# Patient Record
Sex: Male | Born: 1935 | Race: Black or African American | Hispanic: No
Health system: Southern US, Community
[De-identification: ages and names within clinical notes are randomized; demographics above are authoritative.]

---

## 2004-05-24 ENCOUNTER — Other Ambulatory Visit: Payer: Self-pay

## 2004-05-24 ENCOUNTER — Inpatient Hospital Stay: Payer: Self-pay | Admitting: Cardiovascular Disease

## 2006-09-11 ENCOUNTER — Ambulatory Visit: Payer: Self-pay | Admitting: Otolaryngology

## 2006-10-08 ENCOUNTER — Ambulatory Visit: Payer: Self-pay | Admitting: Vascular Surgery

## 2006-11-02 ENCOUNTER — Ambulatory Visit: Payer: Self-pay | Admitting: Oncology

## 2006-11-05 ENCOUNTER — Ambulatory Visit: Payer: Self-pay | Admitting: Vascular Surgery

## 2006-11-05 ENCOUNTER — Other Ambulatory Visit: Payer: Self-pay

## 2006-11-14 ENCOUNTER — Ambulatory Visit: Payer: Self-pay | Admitting: Vascular Surgery

## 2006-11-29 ENCOUNTER — Inpatient Hospital Stay: Payer: Self-pay | Admitting: Internal Medicine

## 2006-11-29 ENCOUNTER — Other Ambulatory Visit: Payer: Self-pay

## 2006-12-03 ENCOUNTER — Ambulatory Visit: Payer: Self-pay | Admitting: Oncology

## 2006-12-18 ENCOUNTER — Ambulatory Visit: Payer: Self-pay | Admitting: Oncology

## 2007-01-02 ENCOUNTER — Ambulatory Visit: Payer: Self-pay | Admitting: Oncology

## 2007-01-10 ENCOUNTER — Ambulatory Visit: Payer: Self-pay | Admitting: Vascular Surgery

## 2007-01-23 ENCOUNTER — Inpatient Hospital Stay: Payer: Self-pay | Admitting: Vascular Surgery

## 2007-06-02 ENCOUNTER — Ambulatory Visit: Payer: Self-pay | Admitting: Oncology

## 2007-06-14 ENCOUNTER — Ambulatory Visit: Payer: Self-pay | Admitting: Oncology

## 2007-07-03 ENCOUNTER — Ambulatory Visit: Payer: Self-pay | Admitting: Oncology

## 2007-08-02 ENCOUNTER — Ambulatory Visit: Payer: Self-pay | Admitting: Oncology

## 2007-08-09 ENCOUNTER — Ambulatory Visit: Payer: Self-pay | Admitting: Oncology

## 2007-09-02 ENCOUNTER — Ambulatory Visit: Payer: Self-pay | Admitting: Oncology

## 2007-10-02 ENCOUNTER — Ambulatory Visit: Payer: Self-pay | Admitting: Oncology

## 2007-11-01 ENCOUNTER — Ambulatory Visit: Payer: Self-pay | Admitting: Oncology

## 2007-11-02 ENCOUNTER — Ambulatory Visit: Payer: Self-pay | Admitting: Oncology

## 2007-11-22 IMAGING — CT CT ANGIOGRAPHY NECK
1 of 4 series · 12 of 33 positions shown · IV contrast (APPLIED)
Comparison: none

REASON FOR EXAM: carotid stenosis
COMMENTS:

PROCEDURE:     CT  - CT ANGIOGRAPHY NECK W/WO  - October 08, 2006  [DATE]
RESULT:
HISTORY: Carotid stenosis.

[Series 4: soft tissue · axial · 0.39mm/px · z∈[-293,-32]mm · 12 of 103 slices shown]
[im 8/103  soft-tissue]
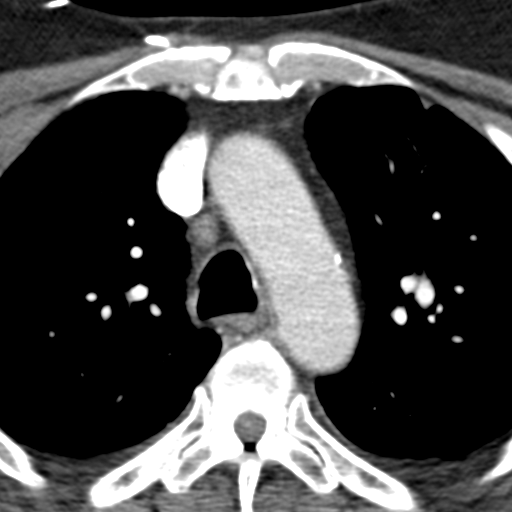
[im 16/103  bone]
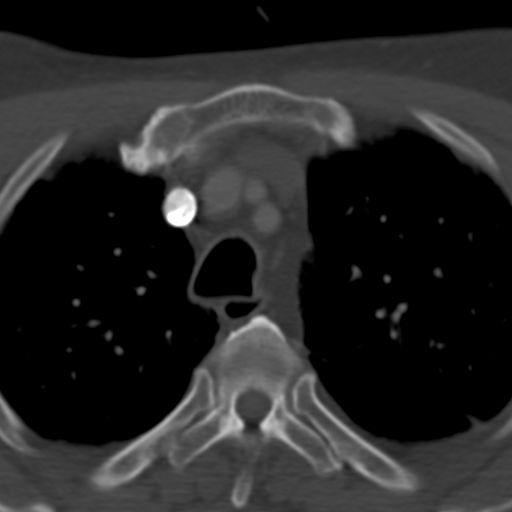
[im 24/103  soft-tissue]
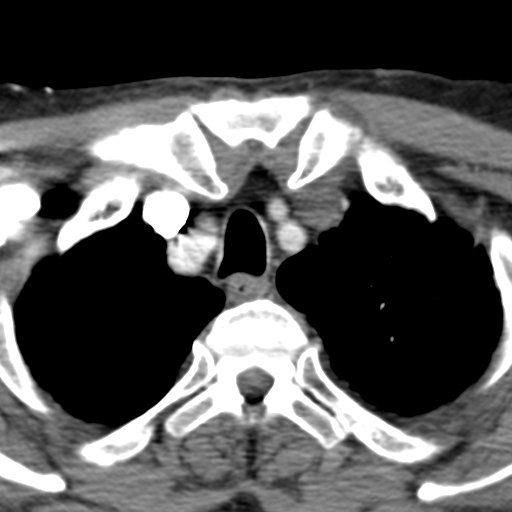
[im 32/103  bone]
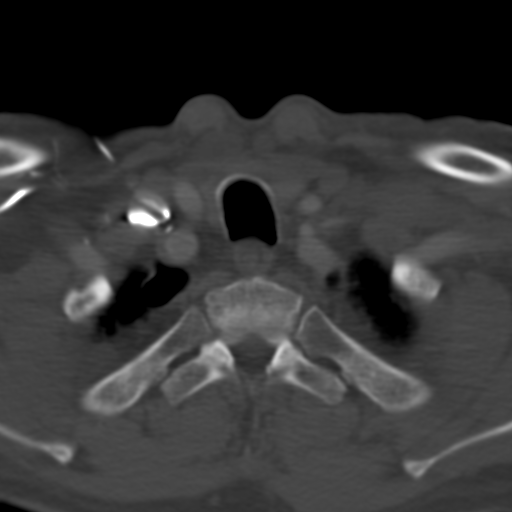
[im 40/103  soft-tissue]
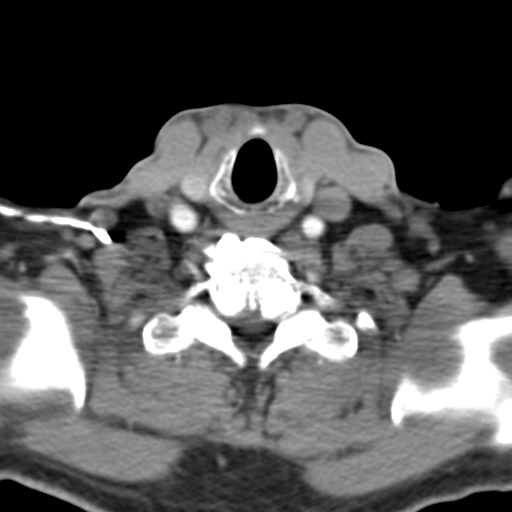
[im 48/103  bone]
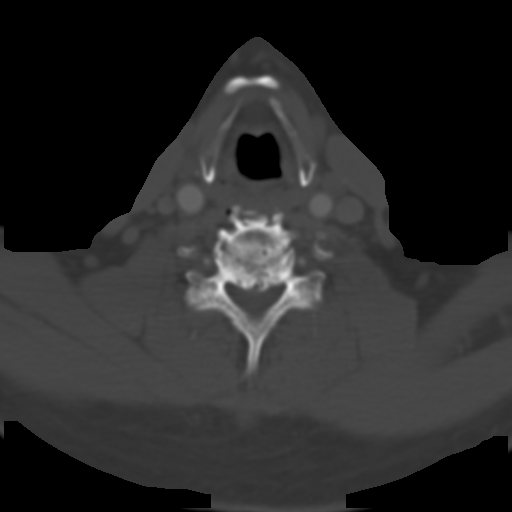
[im 55/103  soft-tissue]
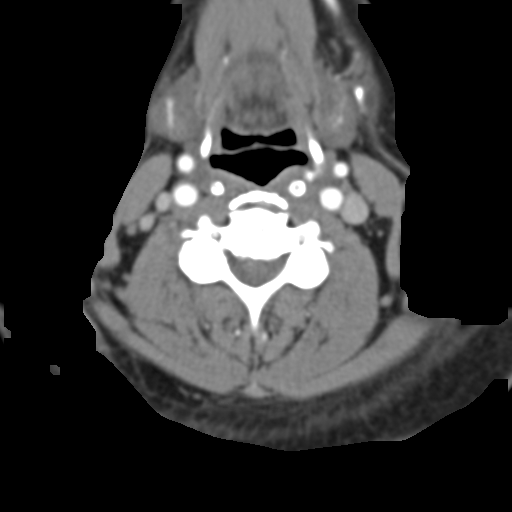
[im 63/103  bone]
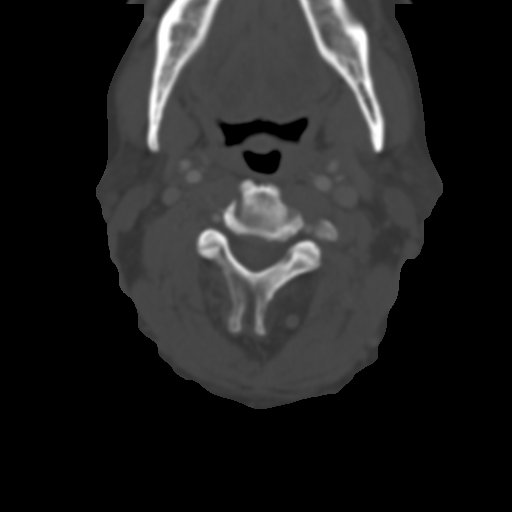
[im 71/103  soft-tissue]
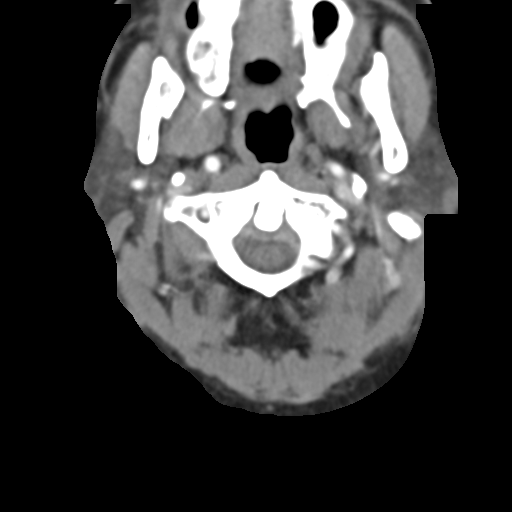
[im 79/103  bone]
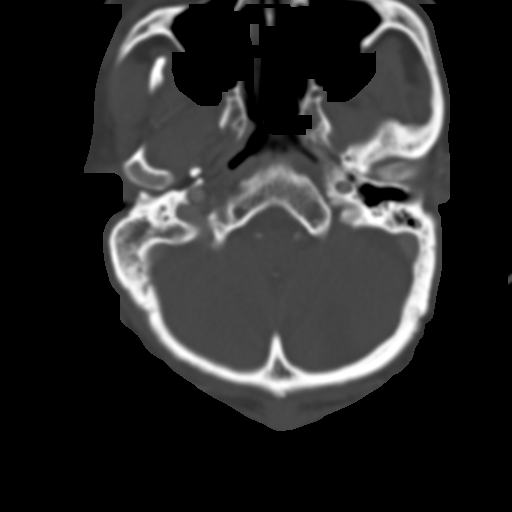
[im 87/103  soft-tissue]
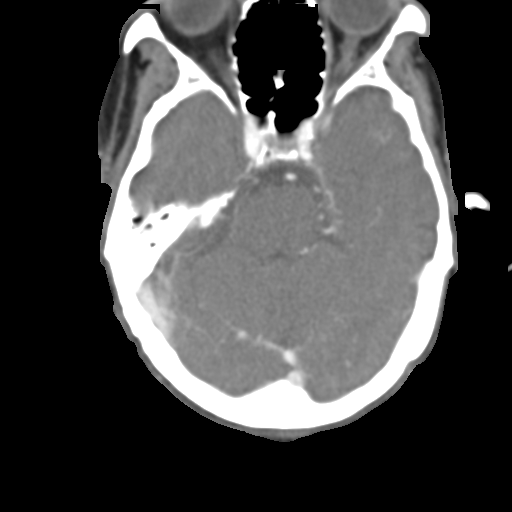
[im 95/103  bone]
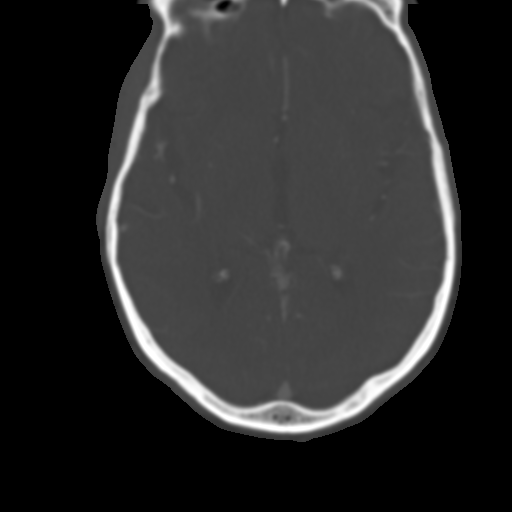

[12 of 33 positions shown; findings below may reference images not displayed]

COMPARISON STUDIES:  Prior ultrasound of the carotids dated 09/11/06.

PROCEDURE AND FINDINGS:  Standard nonionic contrast enhanced CTA is
obtained. Approximately 2.0 cm distal to the LEFT carotid bifurcation is an
approximately 90 to 95%  stenosis involving the LEFT internal carotid
artery. This is at a level slightly higher than the mandibular angle.  The
LEFT common carotid artery is widely patent and arises from the innominate.
On the RIGHT, mild calcified plaque is present. No evidence of RIGHT carotid
system stenosis. The subclavian and vertebrals appear to be patent. Shotty
cervical lymph nodes are noted.
IMPRESSION: High-grade 90 to 95% stenosis proximal LEFT internal carotid artery
approximately 2.0 cm distal to the LEFT carotid bifurcation.  The stenosis
is at the level to slightly above the level of the angle of the mandible.

## 2007-12-03 ENCOUNTER — Ambulatory Visit: Payer: Self-pay | Admitting: Internal Medicine

## 2007-12-03 ENCOUNTER — Ambulatory Visit: Payer: Self-pay | Admitting: Oncology

## 2008-01-02 ENCOUNTER — Ambulatory Visit: Payer: Self-pay | Admitting: Internal Medicine

## 2008-01-13 IMAGING — CR DG CHEST 1V PORT
1 series · 1 of 1 positions shown · non-contrast
Comparison: none

REASON FOR EXAM: weakness
COMMENTS:

PROCEDURE:     DXR - DXR PORTABLE CHEST SINGLE VIEW  - November 29, 2006  [DATE]
RESULT:     Comparison: 11/05/2006.

[view not recorded]
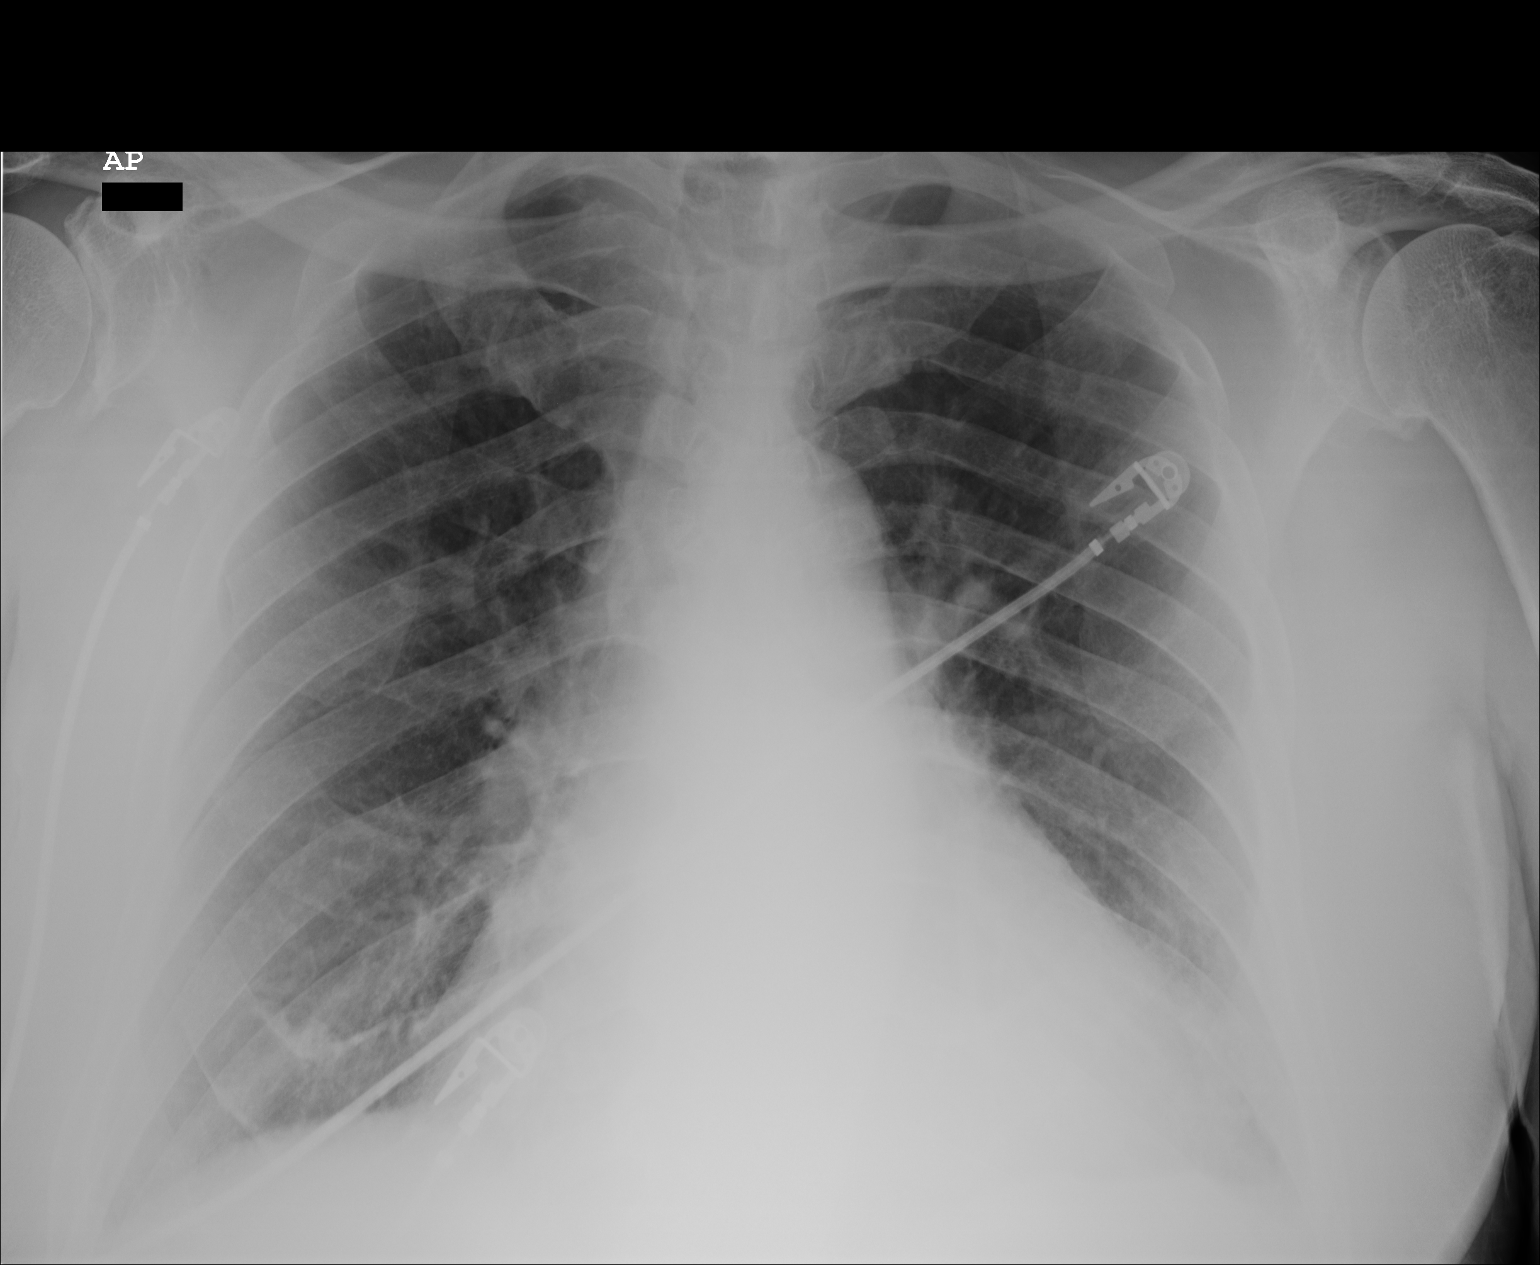

[1 of 1 positions shown; findings below may reference images not displayed]

FINDINGS: There is no significant pulmonary consolidation, pulmonary edema, pleural
effusion, nor pneumothorax. Stable moderate cardiomegaly.
No grossly displaced rib fracture is noted. Mild to moderate degenerative
changes of the bilateral glenohumeral joints are seen.
IMPRESSION: 1. Stable moderate cardiomegaly. No acute cardiopulmonary abnormality is
noted.

## 2008-01-24 ENCOUNTER — Ambulatory Visit: Payer: Self-pay | Admitting: Oncology

## 2009-10-10 ENCOUNTER — Emergency Department: Payer: Self-pay | Admitting: Emergency Medicine

## 2009-10-18 ENCOUNTER — Emergency Department: Payer: Self-pay | Admitting: Emergency Medicine

## 2010-04-05 ENCOUNTER — Emergency Department: Payer: Self-pay | Admitting: Unknown Physician Specialty

## 2010-11-28 ENCOUNTER — Emergency Department: Payer: Self-pay | Admitting: Emergency Medicine

## 2011-03-22 ENCOUNTER — Inpatient Hospital Stay: Payer: Self-pay | Admitting: Internal Medicine

## 2011-07-03 ENCOUNTER — Ambulatory Visit: Payer: Self-pay | Admitting: Internal Medicine

## 2011-07-21 LAB — CBC
HCT: 27.8 % — ABNORMAL LOW (ref 40.0–52.0)
MCH: 29.6 pg (ref 26.0–34.0)
MCHC: 32.6 g/dL (ref 32.0–36.0)
MCV: 91 fL (ref 80–100)
RDW: 19.4 % — ABNORMAL HIGH (ref 11.5–14.5)

## 2011-07-21 LAB — COMPREHENSIVE METABOLIC PANEL
Albumin: 3 g/dL — ABNORMAL LOW (ref 3.4–5.0)
Alkaline Phosphatase: 173 U/L — ABNORMAL HIGH (ref 50–136)
Anion Gap: 13 (ref 7–16)
Bilirubin,Total: 0.4 mg/dL (ref 0.2–1.0)
Chloride: 104 mmol/L (ref 98–107)
Creatinine: 6.86 mg/dL — ABNORMAL HIGH (ref 0.60–1.30)
EGFR (African American): 8 — ABNORMAL LOW
EGFR (Non-African Amer.): 7 — ABNORMAL LOW
Glucose: 101 mg/dL — ABNORMAL HIGH (ref 65–99)
Potassium: 4.7 mmol/L (ref 3.5–5.1)
Total Protein: 7 g/dL (ref 6.4–8.2)

## 2011-07-21 LAB — TROPONIN I: Troponin-I: <0.02 ng/mL

## 2011-07-21 LAB — LIPASE, BLOOD: Lipase: 1391 U/L — ABNORMAL HIGH (ref 73–393)

## 2011-07-22 ENCOUNTER — Inpatient Hospital Stay: Payer: Self-pay | Admitting: Internal Medicine

## 2011-07-22 LAB — URINALYSIS, COMPLETE
Bilirubin,UR: NEGATIVE
Ketone: NEGATIVE
Nitrite: NEGATIVE
Ph: 5 (ref 4.5–8.0)
Protein: 100
RBC,UR: 596 /HPF (ref 0–5)
Specific Gravity: 1.015 (ref 1.003–1.030)
Squamous Epithelial: NONE SEEN

## 2011-07-23 LAB — BASIC METABOLIC PANEL
Anion Gap: 13 (ref 7–16)
Chloride: 111 mmol/L — ABNORMAL HIGH (ref 98–107)
Co2: 17 mmol/L — ABNORMAL LOW (ref 21–32)
Creatinine: 5.86 mg/dL — ABNORMAL HIGH (ref 0.60–1.30)
EGFR (Non-African Amer.): 8 — ABNORMAL LOW
Glucose: 63 mg/dL — ABNORMAL LOW (ref 65–99)
Osmolality: 299 (ref 275–301)

## 2011-07-23 LAB — LIPASE, BLOOD: Lipase: 406 U/L — ABNORMAL HIGH (ref 73–393)

## 2011-07-23 LAB — URINE CULTURE

## 2011-07-24 LAB — CBC WITH DIFFERENTIAL/PLATELET
Basophil #: 0.1 10*3/uL (ref 0.0–0.1)
Eosinophil #: 0.4 10*3/uL (ref 0.0–0.7)
Eosinophil %: 1.9 %
HGB: 8.3 g/dL — ABNORMAL LOW (ref 13.0–18.0)
Lymphocyte #: 0.7 10*3/uL — ABNORMAL LOW (ref 1.0–3.6)
MCH: 28.6 pg (ref 26.0–34.0)
Monocyte #: 1.2 x10 3/mm — ABNORMAL HIGH (ref 0.2–1.0)
Monocyte %: 6.6 %
Neutrophil %: 87.5 %
RBC: 2.91 10*6/uL — ABNORMAL LOW (ref 4.40–5.90)

## 2011-07-24 LAB — BASIC METABOLIC PANEL
Anion Gap: 15 (ref 7–16)
BUN: 61 mg/dL — ABNORMAL HIGH (ref 7–18)
Co2: 15 mmol/L — ABNORMAL LOW (ref 21–32)
EGFR (African American): 12 — ABNORMAL LOW
Osmolality: 308 (ref 275–301)
Potassium: 5.1 mmol/L (ref 3.5–5.1)
Sodium: 147 mmol/L — ABNORMAL HIGH (ref 136–145)

## 2011-07-25 LAB — BASIC METABOLIC PANEL
Anion Gap: 13 (ref 7–16)
Co2: 16 mmol/L — ABNORMAL LOW (ref 21–32)
Creatinine: 4.87 mg/dL — ABNORMAL HIGH (ref 0.60–1.30)
Glucose: 111 mg/dL — ABNORMAL HIGH (ref 65–99)
Osmolality: 302 (ref 275–301)
Sodium: 143 mmol/L (ref 136–145)

## 2011-07-25 LAB — CBC WITH DIFFERENTIAL/PLATELET
Basophil %: 0.3 %
HCT: 26.9 % — ABNORMAL LOW (ref 40.0–52.0)
Lymphocyte #: 0.7 10*3/uL — ABNORMAL LOW (ref 1.0–3.6)
Lymphocyte %: 3.2 %
MCH: 29 pg (ref 26.0–34.0)
MCHC: 31.5 g/dL — ABNORMAL LOW (ref 32.0–36.0)
MCV: 92 fL (ref 80–100)
Monocyte #: 1.4 x10 3/mm — ABNORMAL HIGH (ref 0.2–1.0)
Platelet: 177 10*3/uL (ref 150–440)
WBC: 20.7 10*3/uL — ABNORMAL HIGH (ref 3.8–10.6)

## 2011-07-26 LAB — CBC WITH DIFFERENTIAL/PLATELET
Basophil #: 0 10*3/uL (ref 0.0–0.1)
Basophil %: 0.3 %
Eosinophil %: 3.3 %
HGB: 7.9 g/dL — ABNORMAL LOW (ref 13.0–18.0)
MCH: 29.5 pg (ref 26.0–34.0)
MCV: 92 fL (ref 80–100)
Monocyte %: 7.2 %
Neutrophil #: 13.7 10*3/uL — ABNORMAL HIGH (ref 1.4–6.5)
Neutrophil %: 84.7 %
Platelet: 157 10*3/uL (ref 150–440)
RBC: 2.7 10*6/uL — ABNORMAL LOW (ref 4.40–5.90)
WBC: 16.2 10*3/uL — ABNORMAL HIGH (ref 3.8–10.6)

## 2011-07-26 LAB — BASIC METABOLIC PANEL
Anion Gap: 13 (ref 7–16)
Calcium, Total: 8.8 mg/dL (ref 8.5–10.1)
Co2: 15 mmol/L — ABNORMAL LOW (ref 21–32)
EGFR (African American): 13 — ABNORMAL LOW
EGFR (Non-African Amer.): 11 — ABNORMAL LOW
Glucose: 95 mg/dL (ref 65–99)
Potassium: 4.5 mmol/L (ref 3.5–5.1)

## 2011-07-27 LAB — CULTURE, BLOOD (SINGLE)

## 2011-08-02 ENCOUNTER — Ambulatory Visit: Payer: Self-pay | Admitting: Internal Medicine

## 2011-08-02 DEATH — deceased

## 2014-07-26 NOTE — H&P (Signed)
PATIENT NAMESHELDON, Russell Wolfe MR#:  [id number] DATE OF BIRTH:  02/10/1925  DATE OF ADMISSION:  07/22/2011  PRIMARY CARE PHYSICIAN: Dr. Ellsworth Lennox  CHIEF COMPLAINT: Abdominal pain.   HISTORY OF PRESENT ILLNESS: [name] is an 81 year old African American male with past medical history of chronic kidney disease, history of systemic hypertension, prostate cancer, history of bladder cancer followed up at [facility], history of anemia, last  admitted to this hospital in December 2012 with hemoglobin of 6.3. The patient was brought to the Emergency Department for evaluation of abdominal pain located at the mid abdomen and across the abdomen to the side, especially the left side. This is ongoing for one week. The pain is described as sharp pain, moderate in intensity, with variable severity in that the pain comes and goes. The patient reports no vomiting, no diarrhea, no fever, no chills. Evaluation here in the Emergency Department reveals advanced renal failure. Baseline creatinine was 2 in December and now it is up to 6. His lipase is elevated at more than 1300. His white blood cell count is elevated to more than 20,000. He has elevated liver enzymes and there is also evidence of significant urinary tract infection. The patient was admitted for further evaluation and treatment.   REVIEW OF SYSTEMS: The patient denies having any fever. No chills, but he admits having fatigue. EYES: No blurring of vision. No double vision except for some chronic visual problems in the left eye. He has history of glaucoma. ENT: No sore throat but he has a dry mouth and mucous membranes, mild dysphagia. Some hearing impairment on the left side. CARDIOVASCULAR: No chest pain. No shortness of breath. No syncope. He admits having irregular heart beats, but he states that this has been for a long time, many years. RESPIRATORY: No shortness of breath. No cough. No sputum production. GASTROINTESTINAL: Admits having abdominal pain. No nausea,  no vomiting. No diarrhea. GENITOURINARY: No dysuria or frequency of urination. MUSCULOSKELETAL: No joint pain or swelling. No muscular pain or swelling. INTEGUMENT: No skin rash. No ulcers. NEUROLOGY: No focal weakness. No seizure activity. No headache. PSYCH: No anxiety or depression. ENDOCRINE: No polyuria or polydipsia. No heat or cold intolerance.   PAST MEDICAL HISTORY:  1. History of bladder cancer followed at [facility].  2. History of anemia. His hemoglobin last December was 6.2. He was transfused.  3. History of prostate cancer.  4. History of systemic hypertension.  5. History of carotid stenosis status post left carotid stent implant.  6. History of coronary artery disease.  7. History of congestive cardiomyopathy with ejection fraction of 35%.  8. History of left eye glaucoma.   PAST SURGICAL HISTORY:  1. History of hemorrhoidectomy. 2. History of cataract removal.   SOCIAL HABITS: Ex- chronic smoker. He quit 20 years ago and he has a history of 20 years of smoking. No alcoholism or other drug abuse.   SOCIAL HISTORY: The patient is widowed, lives at home with his son.   FAMILY HISTORY: Negative for cancer and diabetes and negative for premature coronary artery disease.   ADMISSION MEDICATIONS:  1. Aspirin 81 mg a day.  2. Renagel 800 mg 3 times a day.  3. Oxycodone 5 mg every six hours p.r.n.  4. Amlodipine 5 mg once a day.   ALLERGIES: No known drug allergies.   PHYSICAL EXAMINATION:  VITAL SIGNS: Blood pressure 173/86, respiratory rate 20, pulse 78, temperature 99.1, oxygen saturation 93%.   GENERAL APPEARANCE: Elderly male laying in  bed in no acute distress. He appears lethargic and appears dehydrated.   HEAD: Mild pallor. No icterus. No cyanosis.   ENT: Hearing was normal. Nasal mucosa, lips, and tongue showed dry mucous membranes.   EYES: Normal eyelids and conjunctiva. Could not detect reactivity of pupils to light.   NECK: Supple. Trachea at midline.  No cervical masses.   HEART: Regular S1, S2. No S3 or S4. No murmur. No gallop. No carotid bruits.   RESPIRATORY: Normal breathing pattern without use of accessory muscles. No rales. No wheezing.   ABDOMEN: Soft without tenderness. No rebound. No rigidity. No hepatosplenomegaly. No masses. No hernias.   SKIN: No ulcers. No subcutaneous nodules.   MUSCULOSKELETAL:  No joint swelling. No clubbing.   NEUROLOGIC: Cranial nerves II through XII are intact. No focal motor deficit.   PSYCHIATRIC: The patient is alert, oriented to place and people. Mood and affect were flat.   LABS/STUDIES: Ultrasound of the abdomen revealed right hydronephrosis. There is an apparent mass in the region of the right kidney and/or upper ureter with possible second mid right abdominal mass. There is also at least one mass in the urinary bladder. Nonspecific appearance of the gallbladder perhaps including a neoplastic process. Presumed liver metastasis. EKG showed atrial fibrillation and flutter with controlled ventricular rate at 78 per minute. Otherwise unremarkable EKG. Blood sugar 101, BUN 75, creatinine 6.8, sodium 137, potassium 4.7. Estimated GFR 7. Anion gap was normal at 13. Lipase was significantly elevated at 1391. Serum protein 7, albumin 3, total bilirubin 0.4, alkaline phosphatase elevated at 173, AST 54, ALT 26. Troponin less than 0.02. CBC showed white count of 21,000, hemoglobin 9, hematocrit 27, platelet count 194. Urinalysis showed cloudy urine. +3 blood, +3 leukocyte esterase. White blood cells at 300.   ASSESSMENT:  1. Abdominal pain. This is either secondary to acute pancreatitis or metastatic cancer. 2. Acute on chronic renal failure. 3. Right hydronephrosis.  4. Right kidney and ureter mass secondary to his cancer. There is also a bladder mass or urinary bladder mass likely secondary to bladder cancer.  5. Presumed metastatic disease to the liver and gallbladder  6. Anemia of chronic disease and  also iron deficiency anemia.  7. Urinary tract infection.  8. Leukocytosis. 9. Systemic hypertension.  10. History of coronary artery disease.  11. History of congestive cardiomyopathy with ejection fraction of 35%.  12. Other medical  problems include history of prostate cancer, history of peripheral vascular disease and carotid stenosis status post left carotid stent placement, evidence of atrial fibrillation of unknown duration.   PLAN: Admit the patient to the medical floor. Start aggressive IV hydration to correct the dehydration and volume depletion. We will monitor the kidney function to see if there is any reversal in the advanced renal failure. Keep the patient n.p.o. and on IV hydration since he has elevated lipase and abdominal pain. Pain control with morphine. Empiric IV antibiotic using Rocephin to cover for the urinary tract infection pending results of the urine culture. I spoke with the patient who admits that his CODE STATUS is DO NOT RESUSCITATE.  He specifically does not want life support machine or cardiopulmonary resuscitation or cardioversion. This was witnessed by his son, Lorra HalsFabien,  who was in his company. The patient does not have a living will. I will discuss this case again with Dr. Ellsworth Lennoxejan-Sie in the morning and we will address also the need to place the patient under Hospice care given his advanced disease.   TIME SPENT  EVALUATING THIS PATIENT: More than one hour.      ____________________________ Carney Corners. Rudene Re, MD amd:bjt D: 07/22/2011 01:36:10 ET T: 07/22/2011 08:59:21 ET JOB#: [id number]  cc: Carney Corners. Rudene Re, MD, <Dictator> Silas Flood. Ellsworth Lennox, MD Zollie Scale MD ELECTRONICALLY SIGNED 07/23/2011 22:18

## 2014-07-26 NOTE — H&P (Signed)
PATIENT NAME:  Russell Wolfe, Kirk MR#:  [id number] DATE OF BIRTH:  [birth year]  DATE OF ADMISSION:  03/22/2011  CHIEF COMPLAINT: "My blood count is low".  HISTORY OF PRESENT ILLNESS: The patient is an 81 year old male who is being admitted to the hospital for anemia. He was seen in my office last month and had a prescheduled lab assessment on today. Hemoglobin was reported as 6.2 and I instructed the patient to come to the hospital for admission, blood transfusion, and further evaluation. The patient denies any complaints of dizziness, lightheadedness, dyspnea, chest pain, or lethargy. He denies bleeding from any orifices, specifically blood in the stool, epistaxis, hematuria. He also denies abdominal pain. The patient denies any previous episode or history of anemia.   PAST MEDICAL HISTORY:  1. Hypertension. 2. Carotid atherosclerosis, status post stent placement. 3. Prostate cancer.  PAST SURGICAL HISTORY: Prostate surgery.   CURRENT MEDICATIONS:  1. Aspirin 81 mg daily.  2. Lisinopril 10 mg daily.   SOCIAL HISTORY: Ex-smoker x20 years with a 20-pack-year history. Denies use of alcohol or illicit drugs. Currently resides with his brother.   FAMILY HISTORY: Noncontributory.    ALLERGIES: Please see medical reconciliation.   REVIEW OF SYSTEMS: 12 point review of systems negative except as in history of present illness.     PHYSICAL EXAMINATION:   GENERAL: Elderly male, awake, alert, not in acute distress.   VITAL SIGNS: Temperature 97.8, pulse 66 per minute, respirations 18 per minute, blood pressure 167/92.   HEENT: Mild conjunctival pallor. Normocephalic, atraumatic. Anicteric sclerae. Multiple skin tags on his face. No palpable lymphadenopathy. No thyromegaly.   LUNGS: Clear to auscultation bilaterally.   CARDIOVASCULAR: Heart irregular. Heart sounds one and two normal. No murmurs or third or fourth sounds.   ABDOMEN: Full, soft, nontender. No palpable organomegaly. No shifting  dullness. Normal bowel sounds.   EXTREMITIES: Warm and dry. No peripheral edema.   CNS: Awake, alert, oriented. Normal speech. Cranial nerves II through XII intact.   NECK: Supple.   MOTOR EXAM: Normal tone, power, coordination, and reflexes.   EXTREMITIES: Trace edema.   LABORATORY, DIAGNOSTIC, AND RADIOLOGICAL DATA: Hemoglobin 6.2. BMP notable for creatinine of 2.1.   IMPRESSION:  1. Anemia. 2. Hypertension. 3. Renal failure.   PLAN:  1. Will admit to medical floor. 2. Will transfuse 2 units of packed red cells.  3. Will check hematinic indices, iron ferritin, transferrin, Vitamin B12, folic acid levels before transfusion.  4. Will request. Stool Hemoccult to rule out GI bleeding.  5. Hypertension. Will change antihypertensive to amlodipine given his recently discovered renal failure.  6. Cardiovascular disease status post stent. Secondary prevention.  7. GI prophylaxis with compression stockings.   TIME SPENT ON THIS ADMISSION: 55 minutes spent on counseling and coordination of care.   ____________________________ Silas FloodSheikh A. Ellsworth Lennoxejan-Sie, MD sat:drc D: 03/22/2011 18:38:49 ET T: 03/23/2011 05:51:53 ET JOB#: [id number]  cc: Sheikh A. Ellsworth Lennoxejan-Sie, MD, <Dictator> Charlesetta GaribaldiSHEIKH A TEJAN-SIE MD ELECTRONICALLY SIGNED 05/05/2011 12:15

## 2014-07-26 NOTE — Discharge Summary (Signed)
PATIENT NAMERAWLEY, Russell Wolfe MR#:  [id number] DATE OF BIRTH:  07/28/1924  DATE OF ADMISSION:  07/22/2011 DATE OF DISCHARGE:  07/26/2011  DISCHARGE DIAGNOSES:  1. Bladder cancer.  2. End-stage renal disease.  3. Cardiomyopathy with last recorded ejection fraction of 35%.  4. Probable metastatic disease to the liver and possibly to the gallbladder.  5. Acute pancreatitis, now resolved.   DISCHARGE DISPOSITION: Patient is being discharged from the hospital to go home with hospice care. Thus, patient will be discharged with standard hospice home admission orders. Please see hospice home admission orders in the patient's chart for full details in this regard.   HOSPITAL COURSE: Russell Wolfe is an 81 year old African American male with an extensive past medical history of chronic kidney disease, systemic hypertension, prostate cancer, bladder cancer, anemia of chronic disease, and cardiomyopathy with last recorded ejection fraction of 35% who presented to [facility] Emergency Room on date of admission with a chief complaint of abdominal plain. Please see history and physical examination from date of admission for full details regarding his initial presentation, triage, evaluation, and treatment.   Patient was found to have acute pancreatitis, with findings also concerning for possible metastasis of his bladder cancer to his liver and possibly also to his gallbladder. Patient was treated for his pancreatitis in the standard fashion, including antiemetics, analgesics, IV fluids, and keeping the patient initially n.p.o. with slow advancement of diet as tolerated. Patient did show resolution of his pancreatitis, but in light of the patient's myriad medical comorbidities and his overall poor prognosis, palliative care was consulted early in the course of this patient's care. Dr. Elijio Miles, the patient's primary care physician, had ready talked with the family in the outpatient setting  regarding transitioning the patient's care to hospice. At that time, the family was not ready to make such decisions, but during the patient's hospitalization, they did feel that they were ready to make decisions regarding the patient's continued care and goals.   Palliative care did meet with the family during the patient's hospitalization, and as a result of this meeting the family did decide to seek hospice care for Russell Wolfe.   Once arrangements were finalized to have hospice care available at the patient's home, he was discharged from the hospital to return home with hospice care. Overall, the patient's prognosis does remain poor. Family is aware of this patient's prognosis and the implications of hospice care.   DIET: Regular.   ACTIVITY: As tolerated.   TIME SPENT: Discharge time spent including coordination of patient care, education and the like was approximately 20 minutes.  ____________________________ Kizzie Furnish Audie Clear, MSPAS, dictating on behalf of Dr. Elijio Miles mgm:cms D: 07/26/2011 16:34:37 ET T: 07/27/2011 11:10:48 ET JOB#: [id number]  cc: Kizzie Furnish. Prudence Davidson, PA, <Dictator> Elwyn Reach PA ELECTRONICALLY SIGNED 07/27/2011 15:46 Veverly Fells MD ELECTRONICALLY SIGNED 08/03/2011 12:36
